# Patient Record
Sex: Male | Born: 1990 | Hispanic: Yes | Marital: Married | Smoking: Never smoker
Health system: Southern US, Community
[De-identification: ages and names within clinical notes are randomized; demographics above are authoritative.]

---

## 2016-05-18 DIAGNOSIS — S40211A Abrasion of right shoulder, initial encounter: Secondary | ICD-10-CM | POA: Insufficient documentation

## 2016-05-18 DIAGNOSIS — Y92410 Unspecified street and highway as the place of occurrence of the external cause: Secondary | ICD-10-CM | POA: Insufficient documentation

## 2016-05-18 DIAGNOSIS — S40812A Abrasion of left upper arm, initial encounter: Secondary | ICD-10-CM | POA: Insufficient documentation

## 2016-05-18 DIAGNOSIS — K760 Fatty (change of) liver, not elsewhere classified: Secondary | ICD-10-CM | POA: Insufficient documentation

## 2016-05-18 DIAGNOSIS — F10120 Alcohol abuse with intoxication, uncomplicated: Secondary | ICD-10-CM | POA: Insufficient documentation

## 2016-05-18 DIAGNOSIS — Y999 Unspecified external cause status: Secondary | ICD-10-CM | POA: Insufficient documentation

## 2016-05-18 DIAGNOSIS — Y939 Activity, unspecified: Secondary | ICD-10-CM | POA: Insufficient documentation
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
## 2016-05-19 ENCOUNTER — Emergency Department

## 2016-05-19 ENCOUNTER — Emergency Department
Admission: EM | Admit: 2016-05-19 | Discharge: 2016-05-19 | Disposition: A | Discharge status: Home / Self Care | Attending: Emergency Medicine | Admitting: Emergency Medicine

## 2016-05-19 ENCOUNTER — Encounter: Payer: Self-pay | Admitting: Emergency Medicine

## 2016-05-19 DIAGNOSIS — F10920 Alcohol use, unspecified with intoxication, uncomplicated: Secondary | ICD-10-CM

## 2016-05-19 LAB — CBC
HCT: 47.7 % (ref 40.0–52.0)
Hemoglobin: 16.4 g/dL (ref 13.0–18.0)
MCH: 30.3 pg (ref 26.0–34.0)
MCHC: 34.3 g/dL (ref 32.0–36.0)
MCV: 88.5 fL (ref 80.0–100.0)
Platelets: 267 10*3/uL (ref 150–440)
RBC: 5.39 MIL/uL (ref 4.40–5.90)
RDW: 13.7 % (ref 11.5–14.5)
WBC: 11.8 10*3/uL — AB (ref 3.8–10.6)

## 2016-05-19 LAB — BASIC METABOLIC PANEL
ANION GAP: 10 (ref 5–15)
BUN: 11 mg/dL (ref 6–20)
CALCIUM: 9.2 mg/dL (ref 8.9–10.3)
CO2: 27 mmol/L (ref 22–32)
Chloride: 106 mmol/L (ref 101–111)
Creatinine, Ser: 0.88 mg/dL (ref 0.61–1.24)
Glucose, Bld: 114 mg/dL — ABNORMAL HIGH (ref 65–99)
Potassium: 4 mmol/L (ref 3.5–5.1)
Sodium: 143 mmol/L (ref 135–145)

## 2016-05-19 LAB — ETHANOL: Alcohol, Ethyl (B): 226 mg/dL — ABNORMAL HIGH (ref <5)

## 2016-05-19 MED ORDER — IOPAMIDOL (ISOVUE-300) INJECTION 61%
100.0000 mL | Freq: Once | INTRAVENOUS | Status: AC | PRN
  Administered 2016-05-19: 100 mL via INTRAVENOUS
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
## 2016-05-19 NOTE — ED Notes (Signed)
Patient transported to CT 

## 2016-05-19 NOTE — ED Notes (Signed)
Pt back in room. Pt resting.

## 2016-05-19 NOTE — ED Notes (Signed)
Forensic blood draw performed. Blood drawn from the right AC using prep pad enclosed in blood draw kit. Patient tolerated well and performed without incident. Patient remains cooperative at this time.

## 2016-05-19 NOTE — ED Triage Notes (Signed)
Above entered triage noted entered under Anthony Cantu, NT was actually charted by this RN under her name in error
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
